# Patient Record
Sex: Female | Born: 1980 | Race: Black or African American | Hispanic: No | Marital: Married | Smoking: Never smoker
Health system: Southern US, Community
[De-identification: ages and names within clinical notes are randomized; demographics above are authoritative.]

## PROBLEM LIST (undated history)

## (undated) DIAGNOSIS — O99019 Anemia complicating pregnancy, unspecified trimester: Secondary | ICD-10-CM

## (undated) DIAGNOSIS — Z789 Other specified health status: Secondary | ICD-10-CM

## (undated) DIAGNOSIS — D509 Iron deficiency anemia, unspecified: Secondary | ICD-10-CM

## (undated) HISTORY — PX: NO PAST SURGERIES: SHX2092

---

## 2004-11-21 ENCOUNTER — Inpatient Hospital Stay (HOSPITAL_COMMUNITY): Admission: AD | Admit: 2004-11-21 | Discharge: 2004-11-24 | Payer: Self-pay | Admitting: Obstetrics and Gynecology

## 2007-01-06 ENCOUNTER — Inpatient Hospital Stay (HOSPITAL_COMMUNITY): Admission: AD | Admit: 2007-01-06 | Discharge: 2007-01-06 | Payer: Self-pay | Admitting: Obstetrics and Gynecology

## 2007-01-06 ENCOUNTER — Inpatient Hospital Stay (HOSPITAL_COMMUNITY): Admission: AD | Admit: 2007-01-06 | Discharge: 2007-01-08 | Payer: Self-pay | Admitting: Obstetrics and Gynecology

## 2010-10-19 LAB — HEPATITIS B SURFACE ANTIGEN: Hepatitis B Surface Ag: NEGATIVE

## 2010-10-19 NOTE — L&D Delivery Note (Signed)
Delivery Note At 6:08 AM [name] viable and healthy female was delivered via Vaginal, Spontaneous Delivery (vtx ).  APGAR: 9,9 ; weight .    Placenta status:spont intact not sent , .  Cord: around right arm  with the following complications:none .  Cord pH: none Labor progressed quickly. Cord blood donor obtained  Anesthesia:  Epidural Episiotomy: none Lacerations: none Suture Repair: none Est. Blood Loss <300cc  Mom to postpartum.  Baby to nursery-stable.  Katherine [name],Katherine [name] 06/17/2011, 6:35 AM

## 2010-11-07 LAB — ABO/RH: RH Type: POSITIVE

## 2010-11-07 LAB — RPR: RPR: NONREACTIVE

## 2010-11-14 ENCOUNTER — Other Ambulatory Visit: Payer: Self-pay | Admitting: Obstetrics and Gynecology

## 2010-11-14 LAB — GC/CHLAMYDIA PROBE AMP, GENITAL
Chlamydia: NEGATIVE
Gonorrhea: NEGATIVE

## 2011-03-25 LAB — HIV ANTIBODY (ROUTINE TESTING W REFLEX): HIV: NONREACTIVE

## 2011-03-25 LAB — RPR: RPR: NONREACTIVE

## 2011-04-08 ENCOUNTER — Inpatient Hospital Stay (HOSPITAL_COMMUNITY)
Admission: AD | Admit: 2011-04-08 | Discharge: 2011-04-08 | Disposition: A | Discharge status: Home / Self Care | Payer: BC Managed Care – PPO | Source: Ambulatory Visit | Attending: Obstetrics & Gynecology | Admitting: Obstetrics & Gynecology

## 2011-04-08 DIAGNOSIS — B3731 Acute candidiasis of vulva and vagina: Secondary | ICD-10-CM | POA: Insufficient documentation

## 2011-04-08 DIAGNOSIS — O26859 Spotting complicating pregnancy, unspecified trimester: Secondary | ICD-10-CM | POA: Insufficient documentation

## 2011-04-08 DIAGNOSIS — B373 Candidiasis of vulva and vagina: Secondary | ICD-10-CM | POA: Insufficient documentation

## 2011-04-08 DIAGNOSIS — O2390 Unspecified genitourinary tract infection in pregnancy, unspecified trimester: Secondary | ICD-10-CM | POA: Insufficient documentation

## 2011-04-08 LAB — URINALYSIS, ROUTINE W REFLEX MICROSCOPIC
Glucose, UA: NEGATIVE mg/dL
Ketones, ur: NEGATIVE mg/dL
Nitrite: NEGATIVE

## 2011-04-08 LAB — FETAL FIBRONECTIN: Fetal Fibronectin: NEGATIVE

## 2011-05-15 LAB — STREP B DNA PROBE
GBS: NEGATIVE
GBS: NEGATIVE
GBS: NEGATIVE

## 2011-05-25 LAB — RPR: RPR: NONREACTIVE

## 2011-06-16 ENCOUNTER — Inpatient Hospital Stay (HOSPITAL_COMMUNITY)
Admission: RE | Admit: 2011-06-16 | Discharge: 2011-06-18 | DRG: 373 | Disposition: A | Discharge status: Home / Self Care | Payer: BC Managed Care – PPO | Source: Ambulatory Visit | Attending: Obstetrics and Gynecology | Admitting: Obstetrics and Gynecology

## 2011-06-16 ENCOUNTER — Encounter (HOSPITAL_COMMUNITY): Payer: Self-pay

## 2011-06-16 DIAGNOSIS — Z641 Problems related to multiparity: Secondary | ICD-10-CM

## 2011-06-16 DIAGNOSIS — D509 Iron deficiency anemia, unspecified: Secondary | ICD-10-CM | POA: Diagnosis present

## 2011-06-16 DIAGNOSIS — O99019 Anemia complicating pregnancy, unspecified trimester: Secondary | ICD-10-CM | POA: Diagnosis present

## 2011-06-16 HISTORY — DX: Iron deficiency anemia, unspecified: D50.9

## 2011-06-16 HISTORY — DX: Anemia complicating pregnancy, unspecified trimester: O99.019

## 2011-06-16 HISTORY — DX: Other specified health status: Z78.9

## 2011-06-16 LAB — CBC
Hemoglobin: 9.9 g/dL — ABNORMAL LOW (ref 12.0–15.0)
MCH: 23.3 pg — ABNORMAL LOW (ref 26.0–34.0)
Platelets: 220 10*3/uL (ref 150–400)
RBC: 4.25 MIL/uL (ref 3.87–5.11)

## 2011-06-16 MED ORDER — CITRIC ACID-SODIUM CITRATE 334-500 MG/5ML PO SOLN: 30.0000 mL | ORAL | Status: DC | PRN

## 2011-06-16 MED ORDER — OXYTOCIN BOLUS FROM INFUSION
500.0000 mL | Freq: Once | INTRAVENOUS | Status: DC
  Filled 2011-06-16: qty 500

## 2011-06-16 MED ORDER — ONDANSETRON HCL 4 MG/2ML IJ SOLN: 4.0000 mg | Freq: Four times a day (QID) | INTRAMUSCULAR | Status: DC | PRN

## 2011-06-16 MED ORDER — ZOLPIDEM TARTRATE 10 MG PO TABS
10.0000 mg | ORAL_TABLET | Freq: Every evening | ORAL | Status: DC | PRN
  Administered 2011-06-16: 10 mg via ORAL
  Filled 2011-06-16 (×2): qty 1

## 2011-06-16 MED ORDER — OXYTOCIN 20 UNITS IN LACTATED RINGERS INFUSION - SIMPLE: 125.0000 mL/h | INTRAVENOUS | Status: AC

## 2011-06-16 MED ORDER — TERBUTALINE SULFATE 1 MG/ML IJ SOLN: 0.2500 mg | Freq: Once | INTRAMUSCULAR | Status: AC | PRN

## 2011-06-16 MED ORDER — LIDOCAINE HCL (PF) 1 % IJ SOLN
30.0000 mL | INTRAMUSCULAR | Status: DC | PRN
  Filled 2011-06-16: qty 30

## 2011-06-16 MED ORDER — OXYTOCIN 20 UNITS IN LACTATED RINGERS INFUSION - SIMPLE
1.0000 m[IU]/min | INTRAVENOUS | Status: DC
  Administered 2011-06-17: 2 m[IU]/min via INTRAVENOUS
  Filled 2011-06-16: qty 1000

## 2011-06-16 MED ORDER — LACTATED RINGERS IV SOLN
500.0000 mL | INTRAVENOUS | Status: DC | PRN
  Administered 2011-06-17: 1000 mL via INTRAVENOUS

## 2011-06-16 MED ORDER — LACTATED RINGERS IV SOLN
INTRAVENOUS | Status: DC
  Administered 2011-06-16: 21:00:00 via INTRAVENOUS

## 2011-06-16 MED ORDER — OXYTOCIN 10 UNIT/ML IJ SOLN: 10.0000 [IU] | Freq: Once | INTRAMUSCULAR | Status: DC

## 2011-06-16 MED ORDER — ACETAMINOPHEN 325 MG PO TABS: 650.0000 mg | ORAL_TABLET | ORAL | Status: DC | PRN

## 2011-06-16 MED ORDER — OXYCODONE-ACETAMINOPHEN 5-325 MG PO TABS: 2.0000 | ORAL_TABLET | ORAL | Status: DC | PRN

## 2011-06-16 MED ORDER — IBUPROFEN 600 MG PO TABS: 600.0000 mg | ORAL_TABLET | Freq: Four times a day (QID) | ORAL | Status: DC | PRN

## 2011-06-16 MED ORDER — MISOPROSTOL 25 MCG QUARTER TABLET
25.0000 ug | ORAL_TABLET | ORAL | Status: DC | PRN
  Administered 2011-06-16: 25 ug via VAGINAL
  Filled 2011-06-16: qty 0.25

## 2011-06-16 NOTE — Progress Notes (Signed)
06/16/11 2136  OB Interventions  Interventions IV fluid bolus   Dr. Cherly Hensen wants pt to get IV bolus

## 2011-06-16 NOTE — H&P (Signed)
Katherine [name] is a 30 y.o. female presenting for induction due to multiparity and social issue. Uncomplicated PNC History OB History    Grav Para Term Preterm Abortions TAB SAB Ect Mult Living   3 2 2  0 0 0 0 0 0 2     Past Medical History  Diagnosis Date  . No pertinent past medical history    Past Surgical History  Procedure Date  . No past surgeries    Family History: family history is not on file. Social History:  reports that she has never smoked. She has never used smokeless tobacco. She reports that she does not drink alcohol. Her drug history not on file.  Review of Systems  All other systems reviewed and are negative.      Blood pressure 111/67, pulse 89, temperature 98.4 F (36.9 C), temperature source Oral, resp. rate 20, height 5\' 10"  (1.778 m), weight 101.606 kg (224 lb). Maternal Exam:  Abdomen: Patient reports no abdominal tenderness. Fundal height is term.   Estimated fetal weight is 7 1/2 lb.   Fetal presentation: vertex     Physical Exam  Constitutional: She is oriented to person, place, and time. She appears well-developed and well-nourished.  Eyes: EOM are normal.  Neck: Neck supple.  Cardiovascular: Regular rhythm.   Respiratory: Breath sounds normal.  Neurological: She is alert and oriented to person, place, and time.  Skin: Skin is warm and dry.   VE: 1cm/long/-3 Prenatal labs: ABO, Rh: O/Positive/-- (01/20 0000) Antibody: Negative (01/20 0000) Rubella:  Immune RPR: Nonreactive, Nonreactive, Nonreactive (06/06 0000)  HBsAg: Negative (01/01 0000)  HIV: Non-reactive, Non-reactive (06/06 0000)  GBS: Negative, Negative, Negative (07/27 0000)   Tracing: baseline 155 (+) small accels, irreg ctx  Assessment/Plan:Term Multiparity P) Admit routine labs. Cytotec,  May have epidural. Low pitocin in am if needed  Katherine [name] A 06/16/2011, 9:34 PM

## 2011-06-17 ENCOUNTER — Encounter (HOSPITAL_COMMUNITY): Payer: Self-pay | Admitting: Anesthesiology

## 2011-06-17 ENCOUNTER — Inpatient Hospital Stay (HOSPITAL_COMMUNITY): Payer: BC Managed Care – PPO | Admitting: Anesthesiology

## 2011-06-17 ENCOUNTER — Encounter (HOSPITAL_COMMUNITY): Payer: Self-pay

## 2011-06-17 DIAGNOSIS — Z641 Problems related to multiparity: Secondary | ICD-10-CM

## 2011-06-17 LAB — HIV ANTIBODY (ROUTINE TESTING W REFLEX): HIV: NONREACTIVE

## 2011-06-17 LAB — RPR: RPR Ser Ql: NONREACTIVE

## 2011-06-17 LAB — ABO/RH: ABO/RH(D): O POS

## 2011-06-17 MED ORDER — EPHEDRINE 5 MG/ML INJ
10.0000 mg | INTRAVENOUS | Status: DC | PRN
  Filled 2011-06-17: qty 4

## 2011-06-17 MED ORDER — PRENATAL PLUS 27-1 MG PO TABS
1.0000 | ORAL_TABLET | Freq: Every day | ORAL | Status: DC
  Administered 2011-06-17 – 2011-06-18 (×2): 1 via ORAL
  Filled 2011-06-17 (×2): qty 1

## 2011-06-17 MED ORDER — DIPHENHYDRAMINE HCL 50 MG/ML IJ SOLN: 12.5000 mg | INTRAMUSCULAR | Status: DC | PRN

## 2011-06-17 MED ORDER — EPHEDRINE 5 MG/ML INJ: 10.0000 mg | INTRAVENOUS | Status: DC | PRN

## 2011-06-17 MED ORDER — LACTATED RINGERS IV SOLN: 500.0000 mL | Freq: Once | INTRAVENOUS | Status: DC

## 2011-06-17 MED ORDER — IBUPROFEN 600 MG PO TABS
600.0000 mg | ORAL_TABLET | Freq: Four times a day (QID) | ORAL | Status: DC
  Administered 2011-06-17 – 2011-06-18 (×6): 600 mg via ORAL
  Filled 2011-06-17 (×6): qty 1

## 2011-06-17 MED ORDER — PHENYLEPHRINE 40 MCG/ML (10ML) SYRINGE FOR IV PUSH (FOR BLOOD PRESSURE SUPPORT): 80.0000 ug | PREFILLED_SYRINGE | INTRAVENOUS | Status: DC | PRN

## 2011-06-17 MED ORDER — ZOLPIDEM TARTRATE 5 MG PO TABS: 5.0000 mg | ORAL_TABLET | Freq: Every evening | ORAL | Status: DC | PRN

## 2011-06-17 MED ORDER — SIMETHICONE 80 MG PO CHEW: 80.0000 mg | CHEWABLE_TABLET | ORAL | Status: DC | PRN

## 2011-06-17 MED ORDER — DIBUCAINE 1 % RE OINT: 1.0000 "application " | TOPICAL_OINTMENT | RECTAL | Status: DC | PRN

## 2011-06-17 MED ORDER — PHENYLEPHRINE 40 MCG/ML (10ML) SYRINGE FOR IV PUSH (FOR BLOOD PRESSURE SUPPORT)
80.0000 ug | PREFILLED_SYRINGE | INTRAVENOUS | Status: DC | PRN
  Filled 2011-06-17: qty 5

## 2011-06-17 MED ORDER — FENTANYL 2.5 MCG/ML BUPIVACAINE 1/10 % EPIDURAL INFUSION (WH - ANES)
14.0000 mL/h | INTRAMUSCULAR | Status: DC
  Administered 2011-06-17: 16 mL/h via EPIDURAL
  Administered 2011-06-17: 2 mL/h via EPIDURAL
  Filled 2011-06-17: qty 60

## 2011-06-17 MED ORDER — FERROUS SULFATE 325 (65 FE) MG PO TABS
325.0000 mg | ORAL_TABLET | Freq: Two times a day (BID) | ORAL | Status: DC
  Administered 2011-06-17 – 2011-06-18 (×3): 325 mg via ORAL
  Filled 2011-06-17 (×3): qty 1

## 2011-06-17 MED ORDER — SENNOSIDES-DOCUSATE SODIUM 8.6-50 MG PO TABS
2.0000 | ORAL_TABLET | Freq: Every day | ORAL | Status: DC
  Administered 2011-06-17: 2 via ORAL

## 2011-06-17 MED ORDER — LANOLIN HYDROUS EX OINT: TOPICAL_OINTMENT | CUTANEOUS | Status: DC | PRN

## 2011-06-17 MED ORDER — ONDANSETRON HCL 4 MG/2ML IJ SOLN: 4.0000 mg | INTRAMUSCULAR | Status: DC | PRN

## 2011-06-17 MED ORDER — NALBUPHINE SYRINGE 5 MG/0.5 ML
10.0000 mg | INJECTION | Freq: Four times a day (QID) | INTRAMUSCULAR | Status: DC | PRN
  Administered 2011-06-17: 10 mg via INTRAVENOUS
  Filled 2011-06-17: qty 1

## 2011-06-17 MED ORDER — WITCH HAZEL-GLYCERIN EX PADS: 1.0000 "application " | MEDICATED_PAD | CUTANEOUS | Status: DC | PRN

## 2011-06-17 MED ORDER — ONDANSETRON HCL 4 MG PO TABS: 4.0000 mg | ORAL_TABLET | ORAL | Status: DC | PRN

## 2011-06-17 MED ORDER — OXYCODONE-ACETAMINOPHEN 5-325 MG PO TABS
1.0000 | ORAL_TABLET | ORAL | Status: DC | PRN
  Administered 2011-06-17: 1 via ORAL
  Administered 2011-06-17: 2 via ORAL
  Administered 2011-06-18: 1 via ORAL
  Filled 2011-06-17: qty 1
  Filled 2011-06-17: qty 2
  Filled 2011-06-17: qty 1

## 2011-06-17 MED ORDER — DIPHENHYDRAMINE HCL 25 MG PO CAPS: 25.0000 mg | ORAL_CAPSULE | Freq: Four times a day (QID) | ORAL | Status: DC | PRN

## 2011-06-17 MED ORDER — BENZOCAINE-MENTHOL 20-0.5 % EX AERO: 1.0000 "application " | INHALATION_SPRAY | CUTANEOUS | Status: DC | PRN

## 2011-06-17 NOTE — Anesthesia Preprocedure Evaluation (Signed)
Anesthesia Evaluation  Name, MR# and DOB Patient awake  General Assessment Comment  Reviewed: Allergy & Precautions, H&P , NPO status , Patient's Chart, lab work & pertinent test results, reviewed documented beta blocker date and time   History of Anesthesia Complications Negative for: history of anesthetic complications  Airway Mallampati: II TM Distance: >3 FB Neck ROM: full    Dental  (+) Teeth Intact   Pulmonary  clear to auscultation  breath sounds clear to auscultation none    Cardiovascular regular Normal    Neuro/Psych Negative Neurological ROS  Negative Psych ROS  GI/Hepatic/Renal negative GI ROS  negative Liver ROS  negative Renal ROS        Endo/Other  Negative Endocrine ROS (+)      Abdominal   Musculoskeletal   Hematology negative hematology ROS (+)   Peds  Reproductive/Obstetrics (+) Pregnancy    Anesthesia Other Findings             Anesthesia Physical Anesthesia Plan  ASA: II  Anesthesia Plan: Epidural   Post-op Pain Management:    Induction:   Airway Management Planned:   Additional Equipment:   Intra-op Plan:   Post-operative Plan:   Informed Consent: I have reviewed the patients History and Physical, chart, labs and discussed the procedure including the risks, benefits and alternatives for the proposed anesthesia with the patient or authorized representative who has indicated his/her understanding and acceptance.     Plan Discussed with:   Anesthesia Plan Comments:         Anesthesia Quick Evaluation  

## 2011-06-17 NOTE — Treatment Plan (Signed)
GBS neg, HIV neg, RPR NR

## 2011-06-17 NOTE — Progress Notes (Signed)
Katherine [name] is [name] 29 y.o. G3P2002 at term gestation  Now in active labor. Epidural. Pitocin augmentation  Objective: BP 125/75  Pulse 150  Temp(Src) 97.6 F (36.4 C) (Oral)  Resp 20  Ht 5\' 10"  (1.778 m)  Wt 101.606 kg (224 lb)  BMI 32.14 kg/m2  SpO2 100%  Breastfeeding? Unknown      FHT:  FHR: 140 bpm, variability: moderate,  accelerations:  Present,  decelerations:  Absent UC:   regular, every 3 minutes SVE:   Dilation: 6.5 Effacement (%): 90 Station: -1 Exam by:: [name]. white rn  Labs: Lab Results  Component Value Date   WBC 12.7* 06/16/2011   HGB 9.9* 06/16/2011   HCT 30.7* 06/16/2011   MCV 72.2* 06/16/2011   PLT 220 06/16/2011    Assessment / Plan: Induction of labor due to multiparity, social,  progressing well on pitocin  Labor: Progressing normally Preeclampsia:  no signs or symptoms of toxicity Fetal Wellbeing:  Category I Pain Control:  Epidural I/D:  n/[name] Anticipated MOD:  NSVD  Katherine [name] 06/17/2011, 6:31 AM

## 2011-06-17 NOTE — Anesthesia Procedure Notes (Signed)
Epidural Patient location during procedure: OB Start time: 06/17/2011 5:05 AM Reason for block: procedure for pain  Staffing Performed by: anesthesiologist   Preanesthetic Checklist Completed: patient identified, site marked, surgical consent, pre-op evaluation, timeout performed, IV checked, risks and benefits discussed and monitors and equipment checked  Epidural Patient position: sitting Prep: site prepped and draped and DuraPrep Patient monitoring: continuous pulse ox and blood pressure Approach: midline Injection technique: LOR air  Needle:  Needle type: Tuohy  Needle gauge: 17 G Needle length: 9 cm Needle insertion depth: 5 cm cm Catheter type: closed end flexible Catheter size: 19 Gauge Catheter at skin depth: 10 cm Test dose: negative  Assessment Events: blood not aspirated, injection not painful, no injection resistance, negative IV test and no paresthesia

## 2011-06-17 NOTE — Progress Notes (Signed)
Provider notified about pt's status. Wants pitocin to be started

## 2011-06-18 ENCOUNTER — Encounter (HOSPITAL_COMMUNITY): Payer: Self-pay

## 2011-06-18 DIAGNOSIS — D509 Iron deficiency anemia, unspecified: Secondary | ICD-10-CM

## 2011-06-18 DIAGNOSIS — O99019 Anemia complicating pregnancy, unspecified trimester: Secondary | ICD-10-CM

## 2011-06-18 HISTORY — DX: Iron deficiency anemia, unspecified: D50.9

## 2011-06-18 HISTORY — DX: Iron deficiency anemia, unspecified: O99.019

## 2011-06-18 LAB — CBC
Hemoglobin: 8.4 g/dL — ABNORMAL LOW (ref 12.0–15.0)
RBC: 3.57 MIL/uL — ABNORMAL LOW (ref 3.87–5.11)

## 2011-06-18 MED ORDER — IBUPROFEN 600 MG PO TABS: 600.0000 mg | ORAL_TABLET | Freq: Four times a day (QID) | ORAL | Status: AC

## 2011-06-18 MED ORDER — TETANUS-DIPHTH-ACELL PERTUSSIS 5-2.5-18.5 LF-MCG/0.5 IM SUSP
0.5000 mL | Freq: Once | INTRAMUSCULAR | Status: AC
  Administered 2011-06-18: 0.5 mL via INTRAMUSCULAR
  Filled 2011-06-18: qty 0.5

## 2011-06-18 MED ORDER — DOCUSATE SODIUM 100 MG PO CAPS: 100.0000 mg | ORAL_CAPSULE | Freq: Two times a day (BID) | ORAL | Status: DC | PRN

## 2011-06-18 MED ORDER — POLYSACCHARIDE IRON 150 MG PO CAPS
150.0000 mg | ORAL_CAPSULE | Freq: Two times a day (BID) | ORAL | Status: DC
  Filled 2011-06-18 (×2): qty 1

## 2011-06-18 NOTE — Progress Notes (Signed)
Post Partum Day #1            Subjective:   Pt reports feeling well/ Tolerating po/ Voiding without problems/ No n/v/ Bleeding is decreasing/ Pain controlled withprescription NSAID's including ibuprofen (Motrin)  Newborn info:  Information for the patient's newborn:  Quanisha, Drewry [[id number]]  female  / circumcision planned / currently breastfeeding.      Objective:     VS: Blood pressure 107/66, pulse 79, temperature 98.3 F (36.8 C), temperature source Oral, resp. rate 18, height 5\' 10"  (1.778 m), weight 224 lb (101.606 kg), SpO2 96.00%,       Basename 06/18/11 0535 06/16/11 2000  WBC 14.3* 12.7*  HGB 8.4* 9.9*  HCT 26.0* 30.7*  PLT 204 220     Blood type: --/--/O POS (08/28 2130)  Rubella: Immune (01/20 0000)     Physical Exam:   A & O x 3 NAD   Lungs: CTAB  Heart: RR  Abdomen: soft, non-tender, FF @ U  Perineum: repair none, edema none  Lochia: small  Extremities: neg Homans', edema none    Assessment/Plan:  PPD # 1 / 30 y.o., G3P2003 S/P:spontaneous vaginal   IDA chronic with superimposed ABL anemia   Oral Fe and stool softeners   normal postpartum exam  Doing well  Continue routine post partum orders  May consider early D/C after circ if infant stable       LOS: 2 days   PAUL,DANIELA, CNM, MSN 06/18/2011, 9:07 AM

## 2011-06-18 NOTE — Anesthesia Postprocedure Evaluation (Signed)
Anesthesia Post Note  Patient: Katherine [name]  Procedure(s) Performed: CLE  Anesthesia type: Epidural  Patient location: Mother/Baby  Post pain: Pain level controlled  Post assessment: Post-op Vital signs reviewed and Patient's Cardiovascular Status Stable  Last Vitals:  Filed Vitals:   06/18/11 0533  BP: 107/66  Pulse: 79  Temp: 98.3 F (36.8 C)  Resp: 18    Post vital signs: Reviewed and stable  Level of consciousness: awake, alert  and oriented  Complications: No apparent anesthesia complications

## 2011-06-20 ENCOUNTER — Inpatient Hospital Stay (HOSPITAL_COMMUNITY)
Admission: AD | Admit: 2011-06-20 | Discharge: 2011-06-20 | Disposition: A | Discharge status: Home / Self Care | Payer: BC Managed Care – PPO | Source: Ambulatory Visit | Attending: Obstetrics & Gynecology | Admitting: Obstetrics & Gynecology

## 2011-06-20 ENCOUNTER — Inpatient Hospital Stay (HOSPITAL_COMMUNITY): Payer: BC Managed Care – PPO

## 2011-06-20 ENCOUNTER — Encounter (HOSPITAL_COMMUNITY): Payer: Self-pay

## 2011-06-20 DIAGNOSIS — O9081 Anemia of the puerperium: Secondary | ICD-10-CM | POA: Insufficient documentation

## 2011-06-20 DIAGNOSIS — D649 Anemia, unspecified: Secondary | ICD-10-CM | POA: Insufficient documentation

## 2011-06-20 LAB — CBC
HCT: 28.5 % — ABNORMAL LOW (ref 36.0–46.0)
MCV: 73.5 fL — ABNORMAL LOW (ref 78.0–100.0)
RBC: 3.88 MIL/uL (ref 3.87–5.11)
WBC: 11.8 10*3/uL — ABNORMAL HIGH (ref 4.0–10.5)

## 2011-06-20 NOTE — Progress Notes (Signed)
Postpartum delivery on 8/29 vaginal bleeding with blood clots last night.  This morning breastfeed baby and passed two additional clots.

## 2011-06-20 NOTE — ED Provider Notes (Signed)
History   Katherine [name] is a 30 y.o. female G3P3003, 3 days post NSVD. Reports increased VB last nigh passed couple of silver dollar sized clots and this am one clot the tenis-ball sized clot. Now with normal lochia. Moderate cramping with breastfeeding aleved by ibuprofen, no chills, malaise, or fowl odor discharge. Infant breastfeeding well.   Chief Complaint  Patient presents with  . Vaginal Bleeding    with blood clots   HPI Chronic IDA with ABL anemia, Hgb at d/c was 9.1   Past Medical History  Diagnosis Date  . No pertinent past medical history   . Maternal iron deficiency anemia complicating pregnancy 06/18/2011    Past Surgical History  Procedure Date  . No past surgeries     History reviewed. No pertinent family history.  History  Substance Use Topics  . Smoking status: Never Smoker   . Smokeless tobacco: Never Used  . Alcohol Use: No    Allergies: No Known Allergies  Prescriptions prior to admission  Medication Sig Dispense Refill  . acetaminophen (TYLENOL) 500 MG tablet Take 500 mg by mouth every 6 (six) hours as needed. For back pain and headache       . ibuprofen (ADVIL,MOTRIN) 200 MG tablet Take 600 mg by mouth every 6 (six) hours as needed. For pain        . ibuprofen (ADVIL,MOTRIN) 600 MG tablet Take 1 tablet (600 mg total) by mouth every 6 (six) hours.  30 tablet  0  . loratadine (CLARITIN) 10 MG tablet Take 10 mg by mouth as needed.        . prenatal vitamin w/FE, FA (PRENATAL 1 + 1) 27-1 MG TABS Take 1 tablet by mouth daily.          ROS negative except as noted above Physical Exam   Blood pressure 116/76, pulse 90, temperature 99.4 F (37.4 C), temperature source Oral, resp. rate 18.   Physical Exam General: NAD Heart: RRR, no murmurs Lungs: CTA b/l  Abd: Soft, NT, uterus firm, midline between umbilicus and SP Perineum: small lochia, no clots expressed with vigorous fundal massage Ext: no edema Neuro: DTRs normal   MAU Course    Procedures H&H 9.1/28.5 WBC 11.8 Plts 271  US pelvis complete: Uterus: Measures approximately 18 x 8 x 17 cm, consistent with  postpartum state. There are prominent myometrial vessels  consistent with recent postpartum state. The endometrium is  echogenic and thin.  Endometrium: Measures a maximal thickness of 5 mm. No endometrial  thickening or fluid.  Right ovary: Normal appearance/no adnexal mass  Left ovary: The left ovary is not visualized. No left adnexal mass  is seen.  Other Findings: No free fluid     Assessment and Plan  S/P 3days PP stable Pelvis sono wnl, no retained POC in uterus, ABL anemia improving D/C home with bleeding precautions, to call if fever, increased bleeding (clots > baseball sized) Resume oral Fe supp, has rx on hand.  Dr. Cherly Hensen consulted at [facility] 06/20/2011, 11:11 AM

## 2011-06-21 NOTE — Discharge Summary (Signed)
Obstetric Discharge Summary Reason for Admission: induction of labor Prenatal Procedures: ultrasound Intrapartum Procedures: spontaneous vaginal delivery Postpartum Procedures: none Complications-Operative and Postpartum: none Hemoglobin  Date Value Range Status  06/20/2011 9.1* 12.0-15.0 (g/dL) Final     HCT  Date Value Range Status  06/20/2011 28.5* 36.0-46.0 (%) Final    Discharge Diagnoses: Term Pregnancy-delivered Chronic iron deficiency anemia with superimposed acute blood loss anemia  Discharge Information: Date: 06/21/2011 Activity: pelvic rest Diet: routine Medications: PNV and Iron Condition: stable Instructions: refer to practice specific booklet Discharge to: home   Newborn Data: Live born female  Birth Weight: 7 lb 1.6 oz (3220 g) APGAR: 9, 9  Home with mother.  Katherine [name],Katherine [name] 06/21/2011, 12:02 AM

## 2011-06-23 ENCOUNTER — Encounter (HOSPITAL_COMMUNITY): Payer: Self-pay

## 2011-07-14 NOTE — H&P (Signed)
Late entry: Patient LMP was 09/10/2010. At time of Delivery GA 39.5

## 2011-07-14 NOTE — Progress Notes (Signed)
Encounter addended by: Caffie Pinto, RN on: 07/14/2011 10:04 AM<BR>     Documentation filed: Episodes, Inpatient Notes

## 2011-10-16 IMAGING — US US PELVIS COMPLETE
2 series · 14 of 25 positions shown · non-contrast
Comparison: None.

CLINICAL DATA: Evaluate for retained products.  The delivery
06/16/2011.  Pain.

TRANSABDOMINAL ULTRASOUND OF PELVIS
TECHNIQUE: Transabdominal ultrasound examination of the pelvis was
performed including evaluation of the uterus, ovaries, adnexal
regions, and pelvic cul-de-sac.

[Series 1: us pelvis complete · 2 of 6 slices shown (1 of 2)]
[im 1/6]
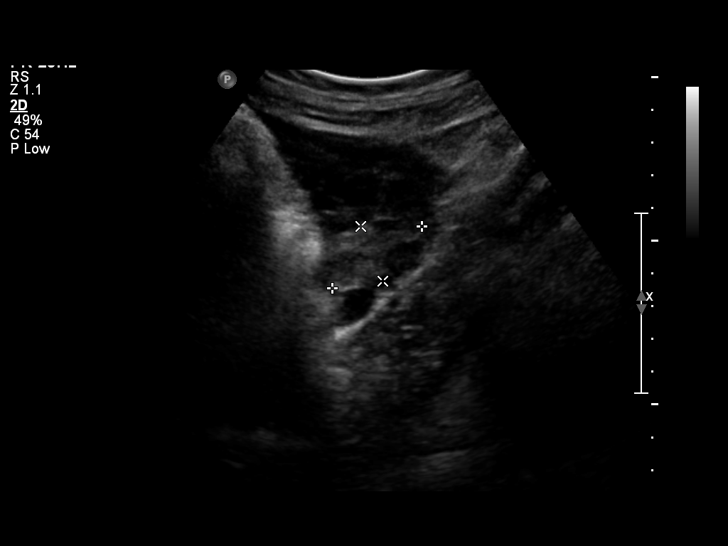
[im 4/6]
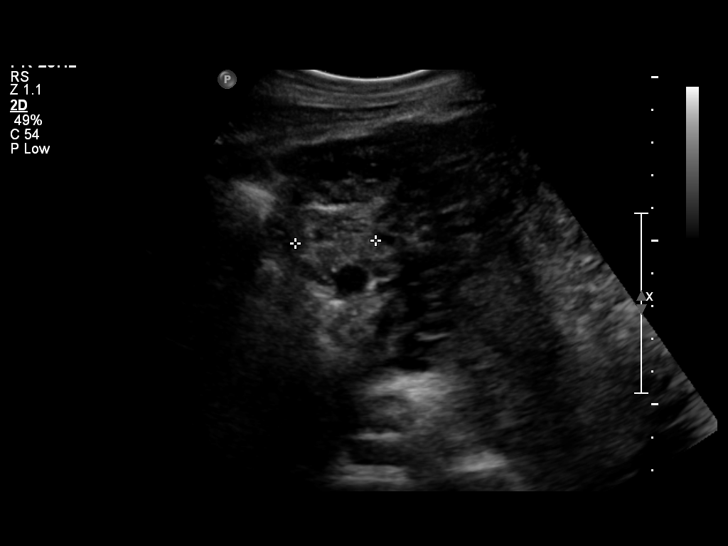

[Series 1: us pelvis complete · 32 acquisitions, 12 frames shown (2 of 2)]
[im 1/32]
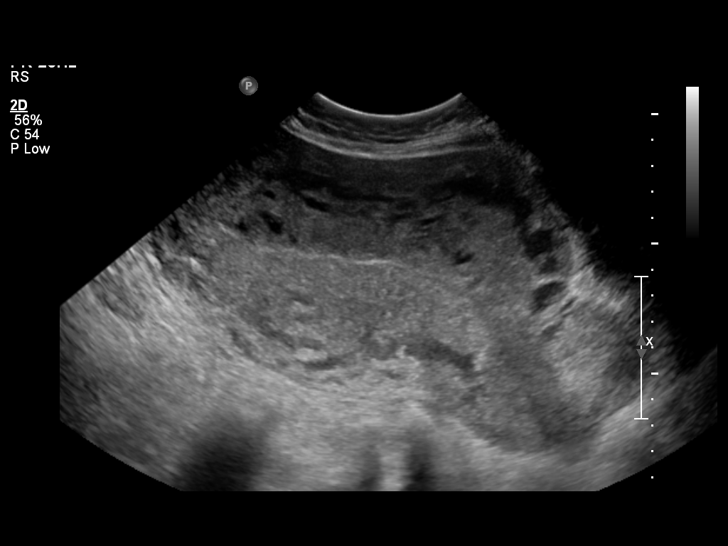
[im 4/32]
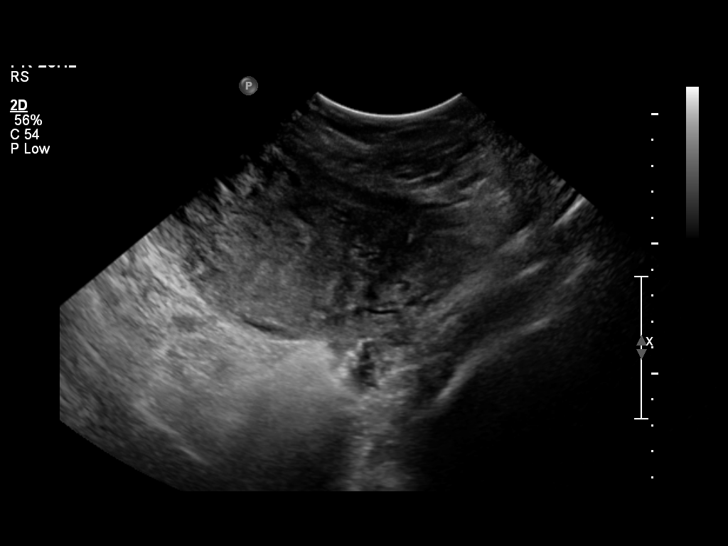
[im 7/32]
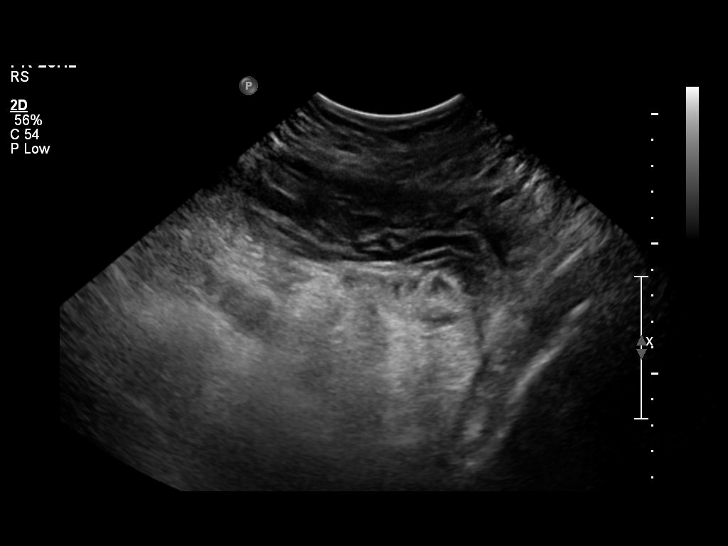
[im 8/32]
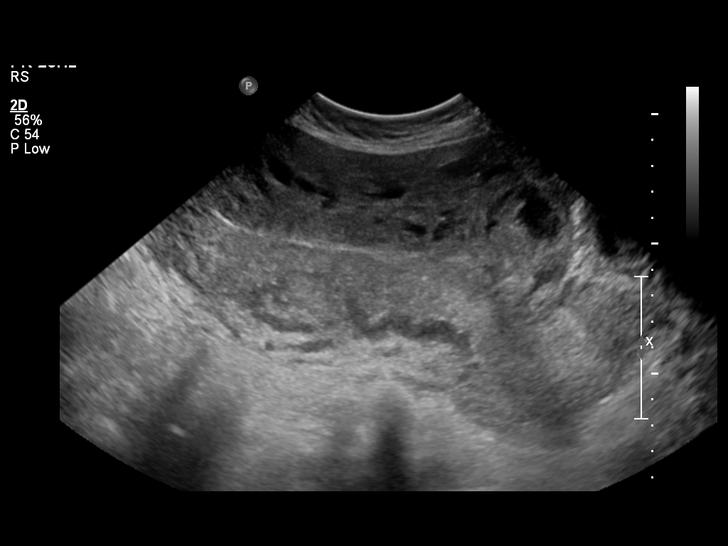
[im 11/32]
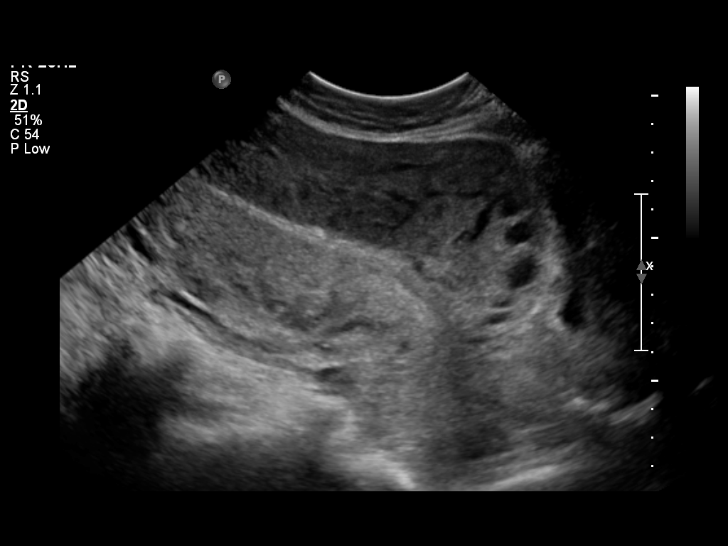
[im 14/32]
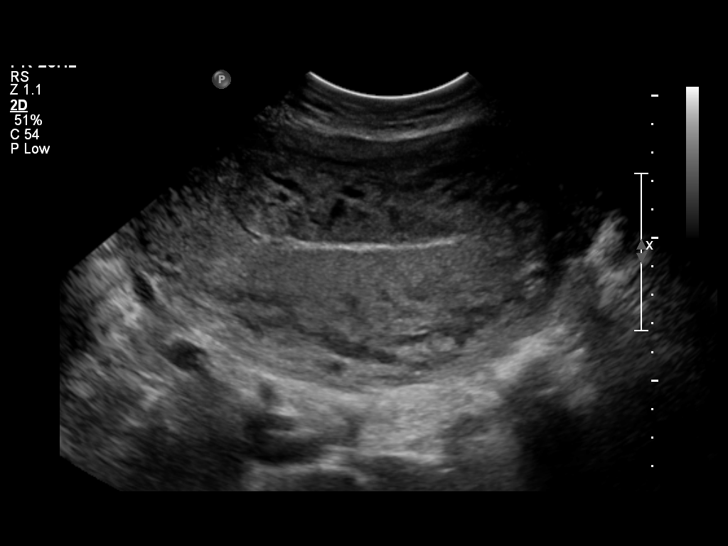
[im 18/32]
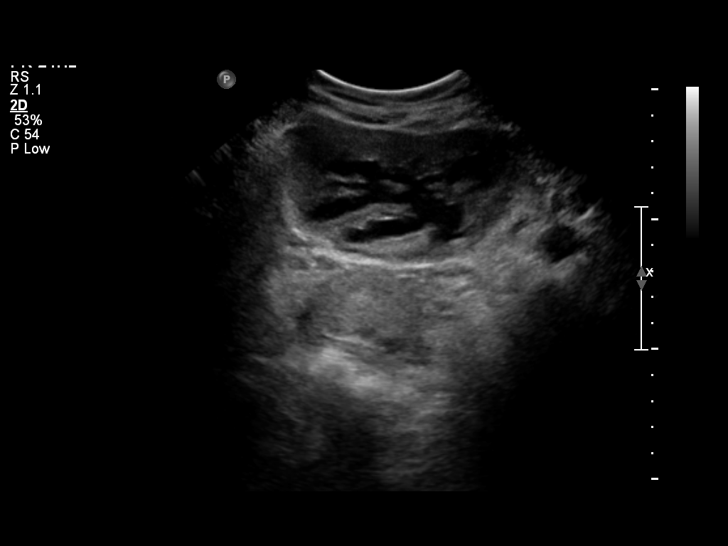
[im 19/32]
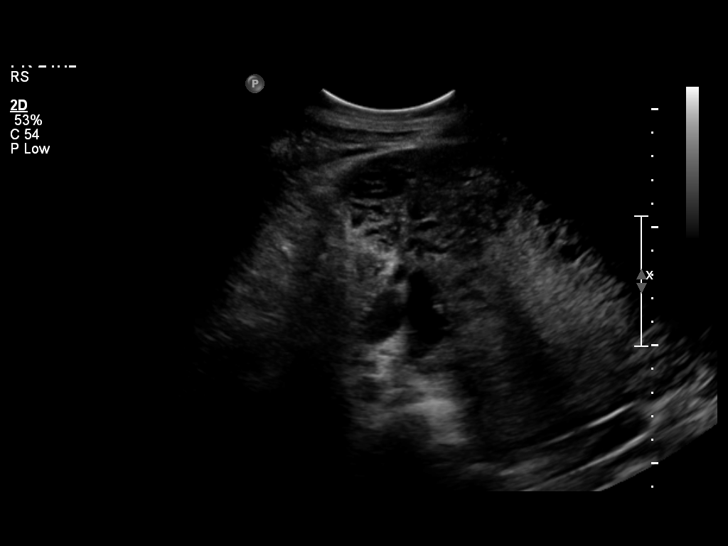
[im 22/32]
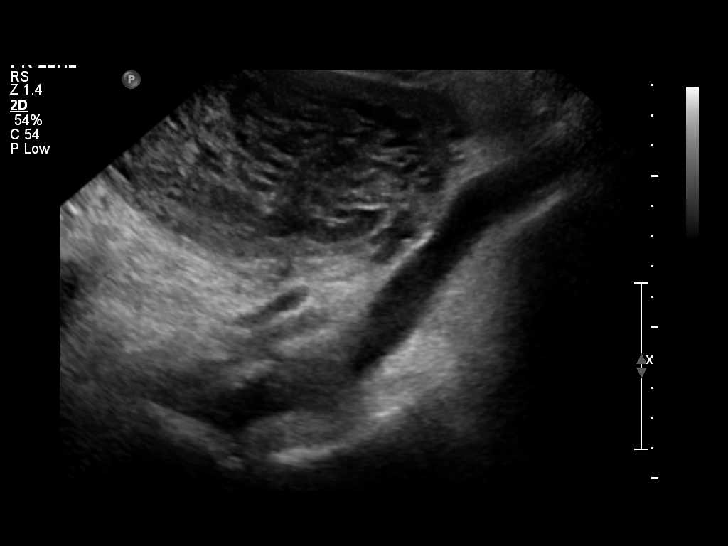
[im 25/32]
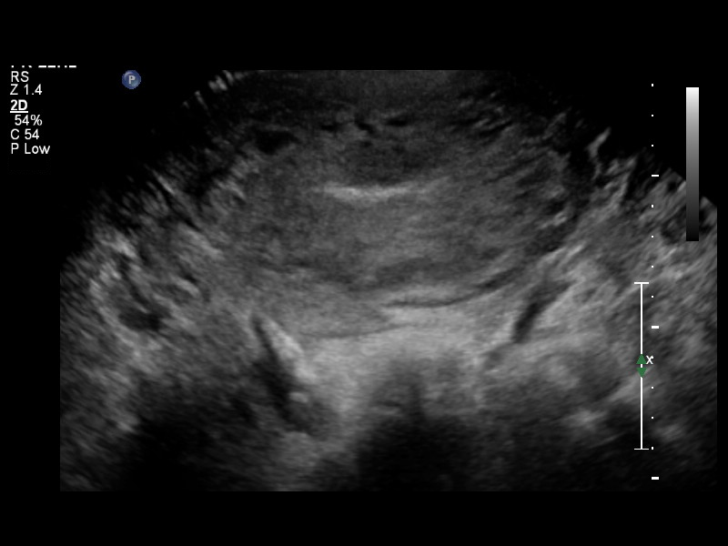
[im 28/32]
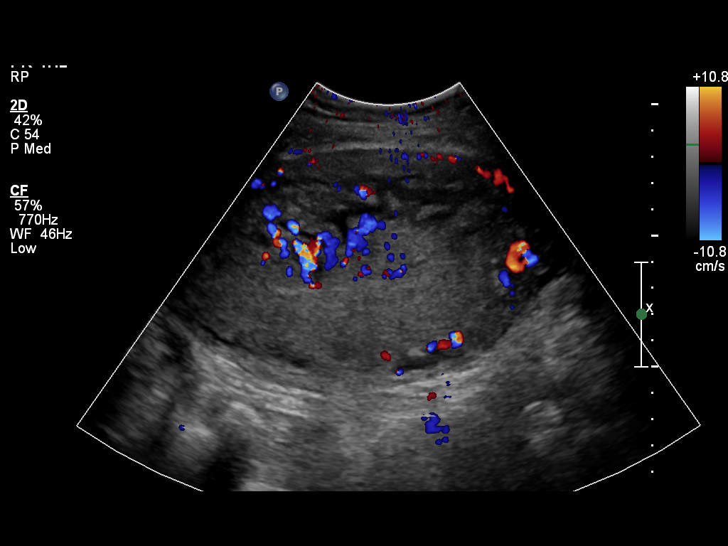
[im 32/32]
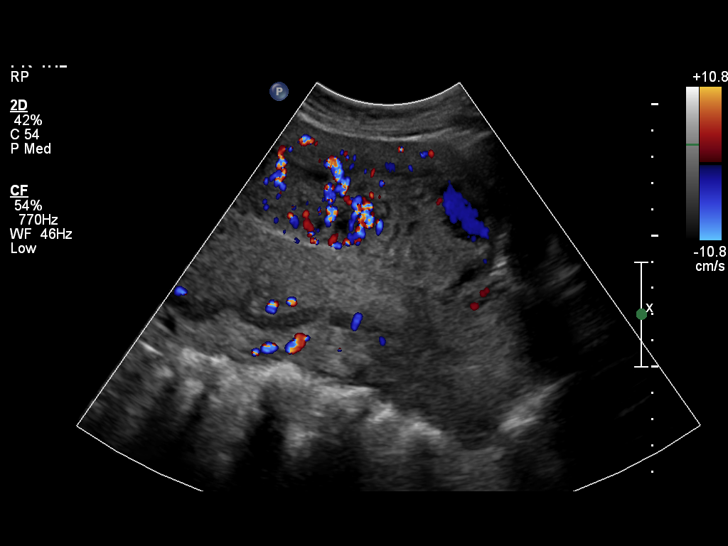

[14 of 25 positions shown; findings below may reference images not displayed]

FINDINGS: Uterus:  Measures approximately 18 x 8 x 17 cm, consistent with
postpartum state.  There are prominent myometrial vessels
consistent with recent postpartum state.  The endometrium is
echogenic and thin.

Endometrium: Measures a maximal thickness of 5 mm.  No endometrial
thickening or fluid.

Right ovary: Normal appearance/no adnexal mass

Left ovary: The left ovary is not visualized.  No left adnexal mass
is seen.

Other Findings:  No free fluid
IMPRESSION: 1.  Postpartum uterus.  Normal endometrial thickness.  No evidence
of retained products of conception.
2.  Nonvisualization of the left ovary.  Normal right ovary.

## 2014-08-20 ENCOUNTER — Encounter (HOSPITAL_COMMUNITY): Payer: Self-pay

## 2016-02-10 ENCOUNTER — Encounter (HOSPITAL_BASED_OUTPATIENT_CLINIC_OR_DEPARTMENT_OTHER): Payer: Self-pay | Admitting: *Deleted

## 2016-02-10 ENCOUNTER — Emergency Department (HOSPITAL_BASED_OUTPATIENT_CLINIC_OR_DEPARTMENT_OTHER): Payer: BC Managed Care – PPO

## 2016-02-10 ENCOUNTER — Emergency Department (HOSPITAL_BASED_OUTPATIENT_CLINIC_OR_DEPARTMENT_OTHER)
Admission: EM | Admit: 2016-02-10 | Discharge: 2016-02-10 | Disposition: A | Discharge status: Home / Self Care | Payer: BC Managed Care – PPO | Attending: Emergency Medicine | Admitting: Emergency Medicine

## 2016-02-10 DIAGNOSIS — Y9389 Activity, other specified: Secondary | ICD-10-CM | POA: Insufficient documentation

## 2016-02-10 DIAGNOSIS — Y998 Other external cause status: Secondary | ICD-10-CM | POA: Diagnosis not present

## 2016-02-10 DIAGNOSIS — S29001A Unspecified injury of muscle and tendon of front wall of thorax, initial encounter: Secondary | ICD-10-CM | POA: Insufficient documentation

## 2016-02-10 DIAGNOSIS — Y92410 Unspecified street and highway as the place of occurrence of the external cause: Secondary | ICD-10-CM | POA: Insufficient documentation

## 2016-02-10 DIAGNOSIS — S6992XA Unspecified injury of left wrist, hand and finger(s), initial encounter: Secondary | ICD-10-CM | POA: Insufficient documentation

## 2016-02-10 DIAGNOSIS — S60229A Contusion of unspecified hand, initial encounter: Secondary | ICD-10-CM | POA: Diagnosis not present

## 2016-02-10 DIAGNOSIS — S63601A Unspecified sprain of right thumb, initial encounter: Secondary | ICD-10-CM | POA: Diagnosis not present

## 2016-02-10 DIAGNOSIS — S6991XA Unspecified injury of right wrist, hand and finger(s), initial encounter: Secondary | ICD-10-CM | POA: Diagnosis present

## 2016-02-10 MED ORDER — IBUPROFEN 400 MG PO TABS
600.0000 mg | ORAL_TABLET | Freq: Once | ORAL | Status: AC
  Administered 2016-02-10: 600 mg via ORAL
  Filled 2016-02-10: qty 1
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
## 2016-02-10 NOTE — ED Notes (Signed)
Verbal order from Dr. Anitra LauthPlunkett.
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
## 2016-02-10 NOTE — Discharge Instructions (Signed)
Finger Sprain °A finger sprain happens when the bands of tissue that hold the finger bones together (ligaments) stretch too much and tear. °HOME CARE °· Keep your injured finger raised (elevated) when possible. °· Put ice on the injured area, twice a day, for 2 to 3 days. °¨ Put ice in a plastic bag. °¨ Place a towel between your skin and the bag. °¨ Leave the ice on for 15 minutes. °· Only take medicine as told by your doctor. °· Do not wear rings on the injured finger. °· Protect your finger until pain and stiffness go away (usually 3 to 4 weeks). °· Do not get your cast or splint to get wet. Cover your cast or splint with a plastic bag when you shower or bathe. Do not swim. °· Your doctor may suggest special exercises for you to do. These exercises will help keep or stop stiffness from happening. °GET HELP RIGHT AWAY IF: °· Your cast or splint gets damaged. °· Your pain gets worse, not better. °MAKE SURE YOU: °· Understand these instructions. °· Will watch your condition. °· Will get help right away if you are not doing well or get worse. °  °This information is not intended to replace advice given to you by your health care provider. Make sure you discuss any questions you have with your health care provider. °  °Document Released: 11/07/2010 Document Revised: 12/28/2011 Document Reviewed: 06/08/2011 °Elsevier Interactive Patient Education ©2016 [employer] ° °
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
